# Patient Record
Sex: Male | Born: 1985 | Race: White | Hispanic: No | Marital: Single | Smoking: Current every day smoker
Health system: Southern US, Community
[De-identification: ages and names within clinical notes are randomized; demographics above are authoritative.]

## PROBLEM LIST (undated history)

## (undated) DIAGNOSIS — M549 Dorsalgia, unspecified: Secondary | ICD-10-CM

## (undated) DIAGNOSIS — R000 Tachycardia, unspecified: Secondary | ICD-10-CM

## (undated) DIAGNOSIS — I10 Essential (primary) hypertension: Secondary | ICD-10-CM

---

## 2014-09-27 ENCOUNTER — Emergency Department (HOSPITAL_BASED_OUTPATIENT_CLINIC_OR_DEPARTMENT_OTHER)
Admission: EM | Admit: 2014-09-27 | Discharge: 2014-09-27 | Disposition: A | Discharge status: Home / Self Care | Payer: Self-pay | Attending: Emergency Medicine | Admitting: Emergency Medicine

## 2014-09-27 ENCOUNTER — Encounter (HOSPITAL_BASED_OUTPATIENT_CLINIC_OR_DEPARTMENT_OTHER): Payer: Self-pay

## 2014-09-27 DIAGNOSIS — Z880 Allergy status to penicillin: Secondary | ICD-10-CM | POA: Insufficient documentation

## 2014-09-27 DIAGNOSIS — I10 Essential (primary) hypertension: Secondary | ICD-10-CM | POA: Insufficient documentation

## 2014-09-27 DIAGNOSIS — M545 Low back pain, unspecified: Secondary | ICD-10-CM

## 2014-09-27 DIAGNOSIS — Z720 Tobacco use: Secondary | ICD-10-CM | POA: Insufficient documentation

## 2014-09-27 DIAGNOSIS — Z79899 Other long term (current) drug therapy: Secondary | ICD-10-CM | POA: Insufficient documentation

## 2014-09-27 HISTORY — DX: Essential (primary) hypertension: I10

## 2014-09-27 HISTORY — DX: Dorsalgia, unspecified: M54.9

## 2014-09-27 HISTORY — DX: Tachycardia, unspecified: R00.0

## 2014-09-27 MED ORDER — METHYLPREDNISOLONE (PAK) 4 MG PO TABS: ORAL_TABLET | ORAL | Status: AC

## 2014-09-27 MED ORDER — OXYCODONE-ACETAMINOPHEN 5-325 MG PO TABS: 1.0000 | ORAL_TABLET | ORAL | Status: DC | PRN

## 2014-09-27 NOTE — Discharge Instructions (Signed)
Back Pain, Adult Low back pain is very common. About 1 in 5 people have back pain.The cause of low back pain is rarely dangerous. The pain often gets better over time.About half of people with a sudden onset of back pain feel better in just 2 weeks. About 8 in 10 people feel better by 6 weeks.  CAUSES Some common causes of back pain include:  Strain of the muscles or ligaments supporting the spine.  Wear and tear (degeneration) of the spinal discs.  Arthritis.  Direct injury to the back. DIAGNOSIS Most of the time, the direct cause of low back pain is not known.However, back pain can be treated effectively even when the exact cause of the pain is unknown.Answering your caregiver's questions about your overall health and symptoms is one of the most accurate ways to make sure the cause of your pain is not dangerous. If your caregiver needs more information, he or she may order lab work or imaging tests (X-rays or MRIs).However, even if imaging tests show changes in your back, this usually does not require surgery. HOME CARE INSTRUCTIONS For many people, back pain returns.Since low back pain is rarely dangerous, it is often a condition that people can learn to manageon their own.   Remain active. It is stressful on the back to sit or stand in one place. Do not sit, drive, or stand in one place for more than 30 minutes at a time. Take short walks on level surfaces as soon as pain allows.Try to increase the length of time you walk each day.  Do not stay in bed.Resting more than 1 or 2 days can delay your recovery.  Do not avoid exercise or work.Your body is made to move.It is not dangerous to be active, even though your back may hurt.Your back will likely heal faster if you return to being active before your pain is gone.  Pay attention to your body when you bend and lift. Many people have less discomfortwhen lifting if they bend their knees, keep the load close to their bodies,and  avoid twisting. Often, the most comfortable positions are those that put less stress on your recovering back.  Find a comfortable position to sleep. Use a firm mattress and lie on your side with your knees slightly bent. If you lie on your back, put a pillow under your knees.  Only take over-the-counter or prescription medicines as directed by your caregiver. Over-the-counter medicines to reduce pain and inflammation are often the most helpful.Your caregiver may prescribe muscle relaxant drugs.These medicines help dull your pain so you can more quickly return to your normal activities and healthy exercise.  Put ice on the injured area.  Put ice in a plastic bag.  Place a towel between your skin and the bag.  Leave the ice on for 15-20 minutes, 03-04 times a day for the first 2 to 3 days. After that, ice and heat may be alternated to reduce pain and spasms.  Ask your caregiver about trying back exercises and gentle massage. This may be of some benefit.  Avoid feeling anxious or stressed.Stress increases muscle tension and can worsen back pain.It is important to recognize when you are anxious or stressed and learn ways to manage it.Exercise is a great option. SEEK MEDICAL CARE IF:  You have pain that is not relieved with rest or medicine.  You have pain that does not improve in 1 week.  You have new symptoms.  You are generally not feeling well. SEEK   IMMEDIATE MEDICAL CARE IF:   You have pain that radiates from your back into your legs.  You develop new bowel or bladder control problems.  You have unusual weakness or numbness in your arms or legs.  You develop nausea or vomiting.  You develop abdominal pain.  You feel faint. Document Released: 06/30/2005 Document Revised: 12/30/2011 Document Reviewed: 11/01/2013 [employer] Patient Information 2015 [employer]. This information is not intended to replace advice given to you by your health care provider. Make sure you  discuss any questions you have with your health care provider.  

## 2014-09-27 NOTE — ED Provider Notes (Signed)
TIME SEEN: 2:20 PM  CHIEF COMPLAINT: Lower back pain  HPI: Pt is a 29 y.o. obese male with history of hypertension and herniated disc at L5-S1 who presents to the emergency department with back pain for the past 2 days radiation into the left leg. Worse with movement and better with rest. States he does not have pain every day but maybe once a year when his herniated disc flares up. He reports he moved here from [place] and does not recommend PCP or orthopedist for follow-up. He is not chronically on pain medication. Reports he has Flexeril and ibuprofen at home which has been taking without relief of pain. States that steroids and Percocet normally help for his symptoms. He is requesting that we give him outpatient follow-up information. No new injury to his back. No numbness, tingling or focal weakness. No bowel or bladder incontinence. No fever.  ROS: See HPI Constitutional: no fever  Eyes: no drainage  ENT: no runny nose   Cardiovascular:  no chest pain  Resp: no SOB  GI: no vomiting GU: no dysuria Integumentary: no rash  Allergy: no hives  Musculoskeletal: no leg swelling  Neurological: no slurred speech ROS otherwise negative  PAST MEDICAL HISTORY/PAST SURGICAL HISTORY:  Past Medical History  Diagnosis Date  . Tachycardia   . Back pain   . Hypertension     MEDICATIONS:  Prior to Admission medications   Medication Sig Start Date End Date Taking? Authorizing Provider  atenolol (TENORMIN) 50 MG tablet Take 50 mg by mouth daily.   Yes Historical Provider, MD  cyclobenzaprine (FLEXERIL) 10 MG tablet Take 10 mg by mouth 3 (three) times daily as needed for muscle spasms.   Yes Historical Provider, MD  ibuprofen (ADVIL,MOTRIN) 800 MG tablet Take 800 mg by mouth every 8 (eight) hours as needed.   Yes Historical Provider, MD    ALLERGIES:  Allergies  Allergen Reactions  . Penicillins Other (See Comments)    Unknown     SOCIAL HISTORY:  History  Substance Use Topics  .  Smoking status: Current Every Day Smoker -- 0.50 packs/day    Types: Cigarettes  . Smokeless tobacco: Not on file  . Alcohol Use: No    FAMILY HISTORY: No family history on file.  EXAM: BP 136/107 mmHg  Pulse 129  Temp(Src) 98.1 F (36.7 C) (Oral)  Resp 18  Ht 5\' 11"  (1.803 m)  Wt 290 lb (131.543 kg)  BMI 40.46 kg/m2  SpO2 97% CONSTITUTIONAL: Alert and oriented and responds appropriately to questions. Well-appearing; well-nourished, nontoxic, smiling, pleasant HEAD: Normocephalic EYES: Conjunctivae clear, PERRL ENT: normal nose; no rhinorrhea; moist mucous membranes; pharynx without lesions noted NECK: Supple, no meningismus, no LAD  CARD: RRR; S1 and S2 appreciated; no murmurs, no clicks, no rubs, no gallops RESP: Normal chest excursion without splinting or tachypnea; breath sounds clear and equal bilaterally; no wheezes, no rhonchi, no rales,  ABD/GI: Normal bowel sounds; non-distended; soft, non-tender, no rebound, no guarding BACK:  The back appears normal and is slightly tender to palpation over the lumbar spine diffusely without step-off or deformity EXT: Normal ROM in all joints; non-tender to palpation; no edema; normal capillary refill; no cyanosis    SKIN: Normal color for age and race; warm NEURO: Moves all extremities equally, sensation to light touch intact diffusely, cranial nerves II through XII intact, strength 5/5 in bilateral lower extremities, 2+ deep tendon reflexes in bilateral lower extremities, no clonus, normal gait PSYCH: The patient's mood and manner are  appropriate. Grooming and personal hygiene are appropriate.  MEDICAL DECISION MAKING: Patient here with exacerbation of back pain. Has a known history of herniated disc. No new neurologic deficits. States he has been taking Flexeril which he had left over from a doctor in [place] and ibuprofen without relief. We'll discharge with Percocet and Medrol Dosepak. Will get outpatient orthopedic and PCP follow-up  information. No red flag symptoms to suggest patient needs imaging. Neurologically intact, hemodynamically stable, afebrile. Discussed return precautions. He verbalizes understanding and is comfortable with plan.    Layla Maw Ward, DO 09/27/14 1435

## 2014-09-27 NOTE — ED Notes (Signed)
MD at bedside. Pt ambulatory to restroom without difficulty following md eval.

## 2014-09-27 NOTE — ED Notes (Signed)
Pt reports chronic back pain, just moved here 1.5 mo ago and has not set up pcp for pain management.  Hx of bulging disc in L5-S1.  Today with pain in lower lumbar, radiation down left leg, no n/t or loss of bowel/bladder.  No heavy lifting or new injury.

## 2014-10-08 ENCOUNTER — Emergency Department (HOSPITAL_COMMUNITY)
Admission: EM | Admit: 2014-10-08 | Discharge: 2014-10-09 | Disposition: A | Discharge status: Home / Self Care | Payer: Self-pay | Attending: Emergency Medicine | Admitting: Emergency Medicine

## 2014-10-08 ENCOUNTER — Emergency Department (HOSPITAL_COMMUNITY): Payer: Self-pay

## 2014-10-08 ENCOUNTER — Encounter (HOSPITAL_COMMUNITY): Payer: Self-pay | Admitting: Emergency Medicine

## 2014-10-08 DIAGNOSIS — R000 Tachycardia, unspecified: Secondary | ICD-10-CM | POA: Insufficient documentation

## 2014-10-08 DIAGNOSIS — Z880 Allergy status to penicillin: Secondary | ICD-10-CM | POA: Insufficient documentation

## 2014-10-08 DIAGNOSIS — Y9389 Activity, other specified: Secondary | ICD-10-CM | POA: Insufficient documentation

## 2014-10-08 DIAGNOSIS — I10 Essential (primary) hypertension: Secondary | ICD-10-CM | POA: Insufficient documentation

## 2014-10-08 DIAGNOSIS — Z79899 Other long term (current) drug therapy: Secondary | ICD-10-CM | POA: Insufficient documentation

## 2014-10-08 DIAGNOSIS — Y92410 Unspecified street and highway as the place of occurrence of the external cause: Secondary | ICD-10-CM | POA: Insufficient documentation

## 2014-10-08 DIAGNOSIS — Y998 Other external cause status: Secondary | ICD-10-CM | POA: Insufficient documentation

## 2014-10-08 DIAGNOSIS — Z720 Tobacco use: Secondary | ICD-10-CM | POA: Insufficient documentation

## 2014-10-08 DIAGNOSIS — M5432 Sciatica, left side: Secondary | ICD-10-CM | POA: Insufficient documentation

## 2014-10-08 MED ORDER — KETOROLAC TROMETHAMINE 60 MG/2ML IM SOLN
60.0000 mg | Freq: Once | INTRAMUSCULAR | Status: AC
  Administered 2014-10-08: 60 mg via INTRAMUSCULAR
  Filled 2014-10-08: qty 2

## 2014-10-08 MED ORDER — OXYCODONE-ACETAMINOPHEN 5-325 MG PO TABS: 1.0000 | ORAL_TABLET | Freq: Four times a day (QID) | ORAL | Status: AC | PRN
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
## 2014-10-09 NOTE — Discharge Instructions (Signed)
Sciatica Sciatica is pain, weakness, numbness, or tingling along the path of the sciatic nerve. The nerve starts in the lower back and runs down the back of each leg. The nerve controls the muscles in the lower leg and in the back of the knee, while also providing sensation to the back of the thigh, lower leg, and the sole of your foot. Sciatica is a symptom of another medical condition. For instance, nerve damage or certain conditions, such as a herniated disk or bone spur on the spine, pinch or put pressure on the sciatic nerve. This causes the pain, weakness, or other sensations normally associated with sciatica. Generally, sciatica only affects one side of the body. CAUSES   Herniated or slipped disc.  Degenerative disk disease.  A pain disorder involving the narrow muscle in the buttocks (piriformis syndrome).  Pelvic injury or fracture.  Pregnancy.  Tumor (rare). SYMPTOMS  Symptoms can vary from mild to very severe. The symptoms usually travel from the low back to the buttocks and down the back of the leg. Symptoms can include:  Mild tingling or dull aches in the lower back, leg, or hip.  Numbness in the back of the calf or sole of the foot.  Burning sensations in the lower back, leg, or hip.  Sharp pains in the lower back, leg, or hip.  Leg weakness.  Severe back pain inhibiting movement. These symptoms may get worse with coughing, sneezing, laughing, or prolonged sitting or standing. Also, being overweight may worsen symptoms. DIAGNOSIS  Your caregiver will perform a physical exam to look for common symptoms of sciatica. He or she may ask you to do certain movements or activities that would trigger sciatic nerve pain. Other tests may be performed to find the cause of the sciatica. These may include:  Blood tests.  X-rays.  Imaging tests, such as an MRI or CT scan. TREATMENT  Treatment is directed at the cause of the sciatic pain. Sometimes, treatment is not necessary  and the pain and discomfort goes away on its own. If treatment is needed, your caregiver may suggest:  Over-the-counter medicines to relieve pain.  Prescription medicines, such as anti-inflammatory medicine, muscle relaxants, or narcotics.  Applying heat or ice to the painful area.  Steroid injections to lessen pain, irritation, and inflammation around the nerve.  Reducing activity during periods of pain.  Exercising and stretching to strengthen your abdomen and improve flexibility of your spine. Your caregiver may suggest losing weight if the extra weight makes the back pain worse.  Physical therapy.  Surgery to eliminate what is pressing or pinching the nerve, such as a bone spur or part of a herniated disk. HOME CARE INSTRUCTIONS   Only take over-the-counter or prescription medicines for pain or discomfort as directed by your caregiver.  Apply ice to the affected area for 20 minutes, 3-4 times a day for the first 48-72 hours. Then try heat in the same way.  Exercise, stretch, or perform your usual activities if these do not aggravate your pain.  Attend physical therapy sessions as directed by your caregiver.  Keep all follow-up appointments as directed by your caregiver.  Do not wear high heels or shoes that do not provide proper support.  Check your mattress to see if it is too soft. A firm mattress may lessen your pain and discomfort. SEEK IMMEDIATE MEDICAL CARE IF:   You lose control of your bowel or bladder (incontinence).  You have increasing weakness in the lower back, pelvis, buttocks,   or legs.  You have redness or swelling of your back.  You have a burning sensation when you urinate.  You have pain that gets worse when you lie down or awakens you at night.  Your pain is worse than you have experienced in the past.  Your pain is lasting longer than 4 weeks.  You are suddenly losing weight without reason. MAKE SURE YOU:  Understand these  instructions.  Will watch your condition.  Will get help right away if you are not doing well or get worse. Document Released: 06/24/2001 Document Revised: 12/30/2011 Document Reviewed: 11/09/2011 [employer] Patient Information 2015 [employer]. This information is not intended to replace advice given to you by your health care provider. Make sure you discuss any questions you have with your health care provider.  
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
